# Patient Record
Sex: Male | Born: 1975 | Race: Black or African American | Hispanic: No | Marital: Single | Smoking: Former smoker
Health system: Southern US, Community
[De-identification: ages and names within clinical notes are randomized; demographics above are authoritative.]

---

## 2004-11-07 ENCOUNTER — Emergency Department: Payer: Self-pay | Admitting: Emergency Medicine

## 2004-12-03 ENCOUNTER — Emergency Department: Payer: Self-pay | Admitting: Emergency Medicine

## 2005-01-06 ENCOUNTER — Emergency Department: Payer: Self-pay | Admitting: Emergency Medicine

## 2005-03-28 ENCOUNTER — Emergency Department: Payer: Self-pay | Admitting: Emergency Medicine

## 2005-03-29 ENCOUNTER — Emergency Department: Payer: Self-pay | Admitting: Emergency Medicine

## 2006-05-06 ENCOUNTER — Emergency Department: Payer: Self-pay | Admitting: Emergency Medicine

## 2007-07-14 IMAGING — CR DG SHOULDER 3+V*R*
1 series · 4 of 4 positions shown · non-contrast
Comparison: none

REASON FOR EXAM: PAIN
COMMENTS:

[Series 1537: antero_posterior · 0.22mm/px · 4 of 4 slices shown]
[im 1/4]
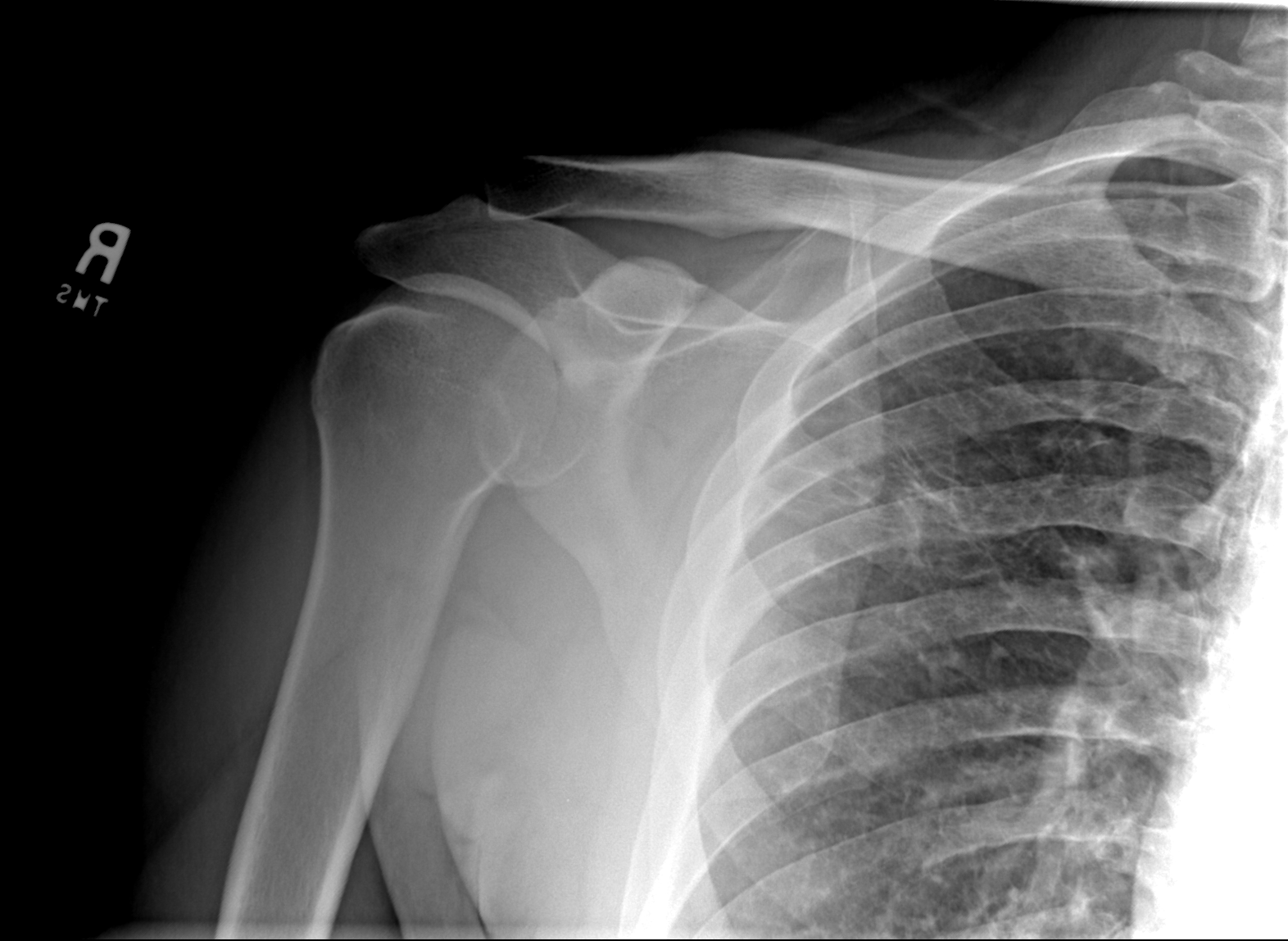
[im 2/4]
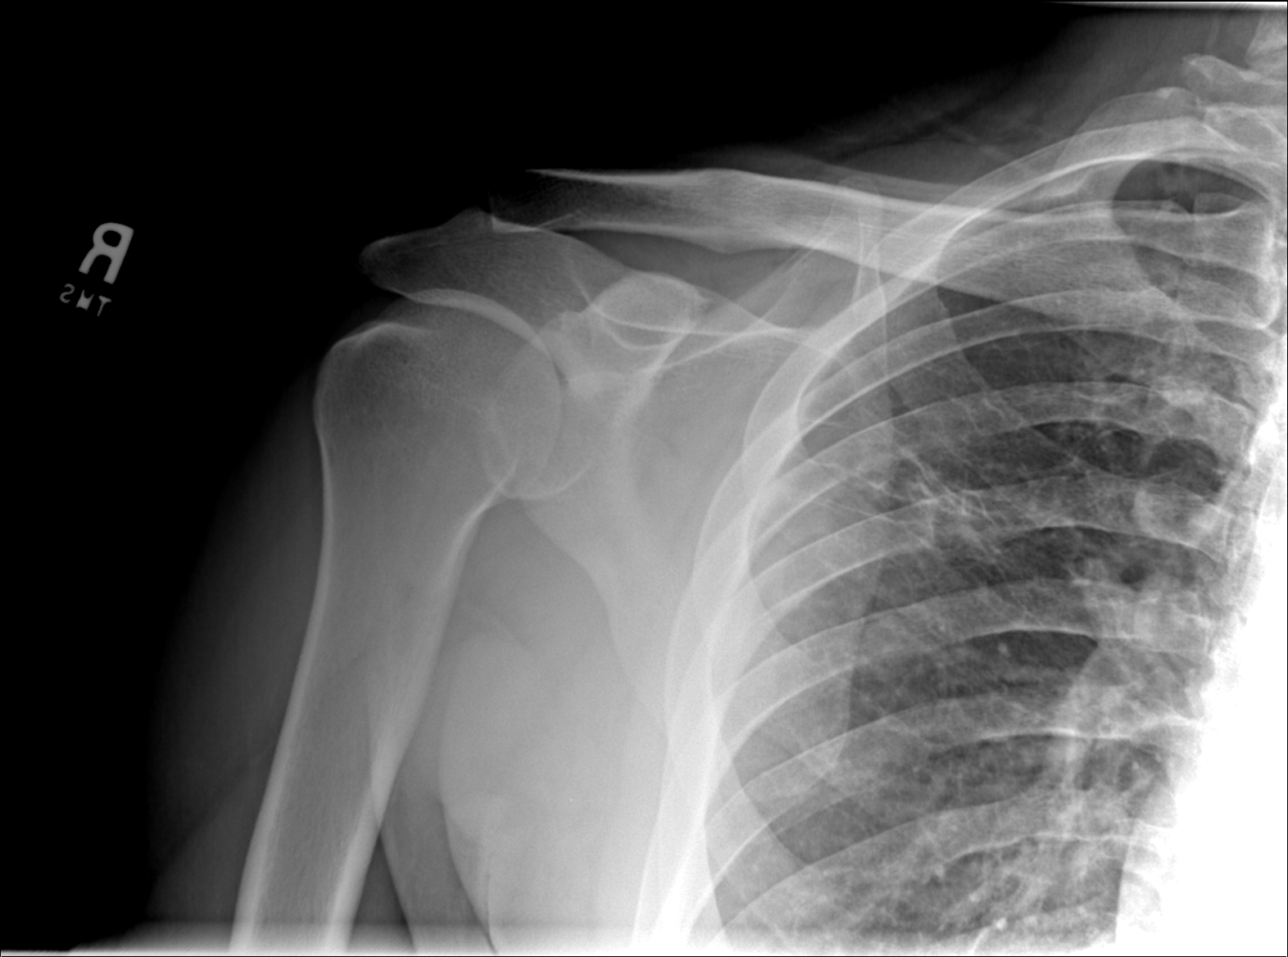
[im 3/4]
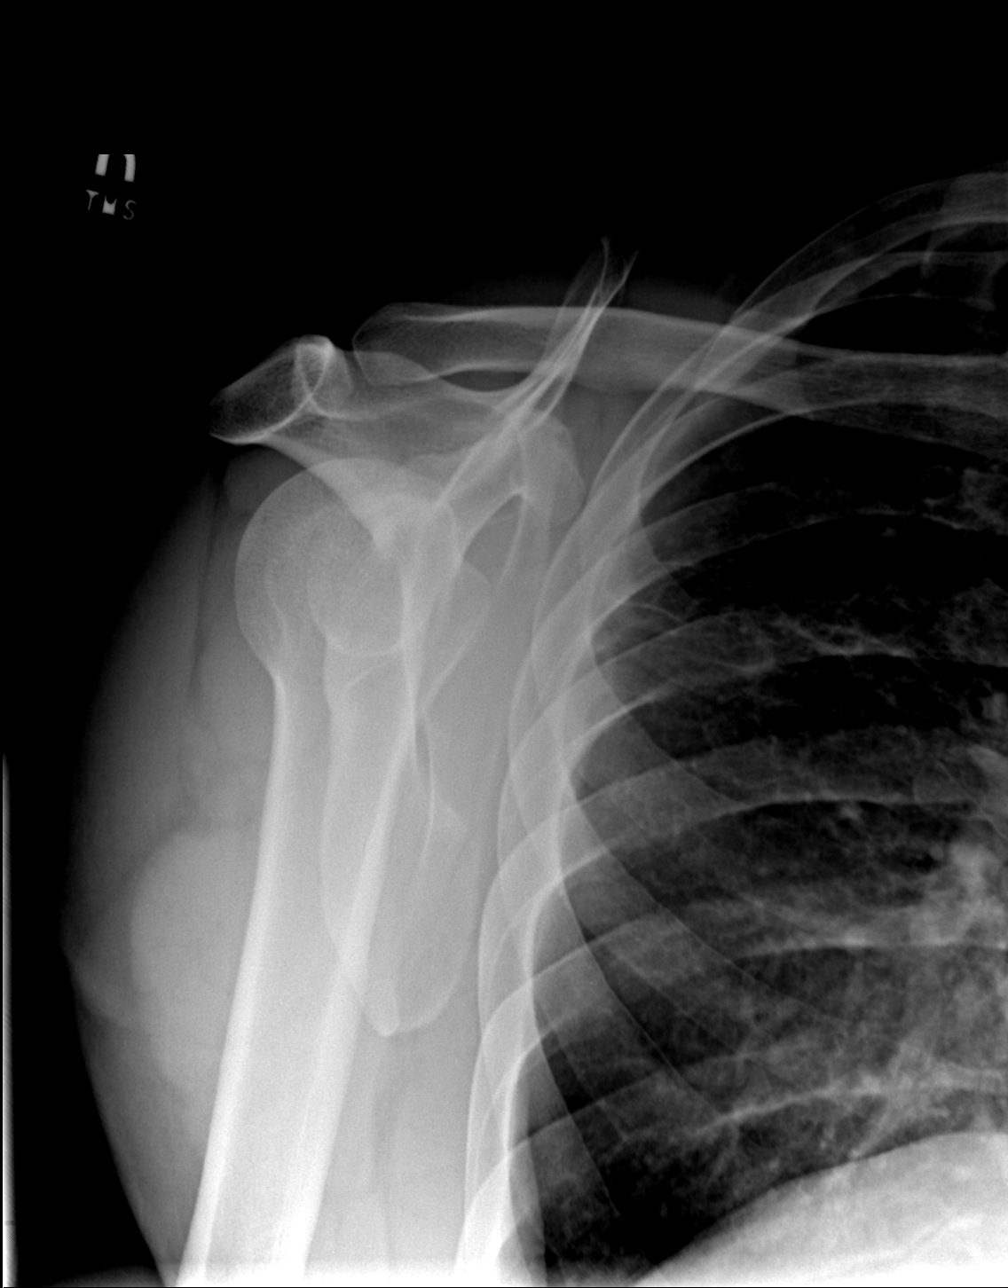
[im 4/4]
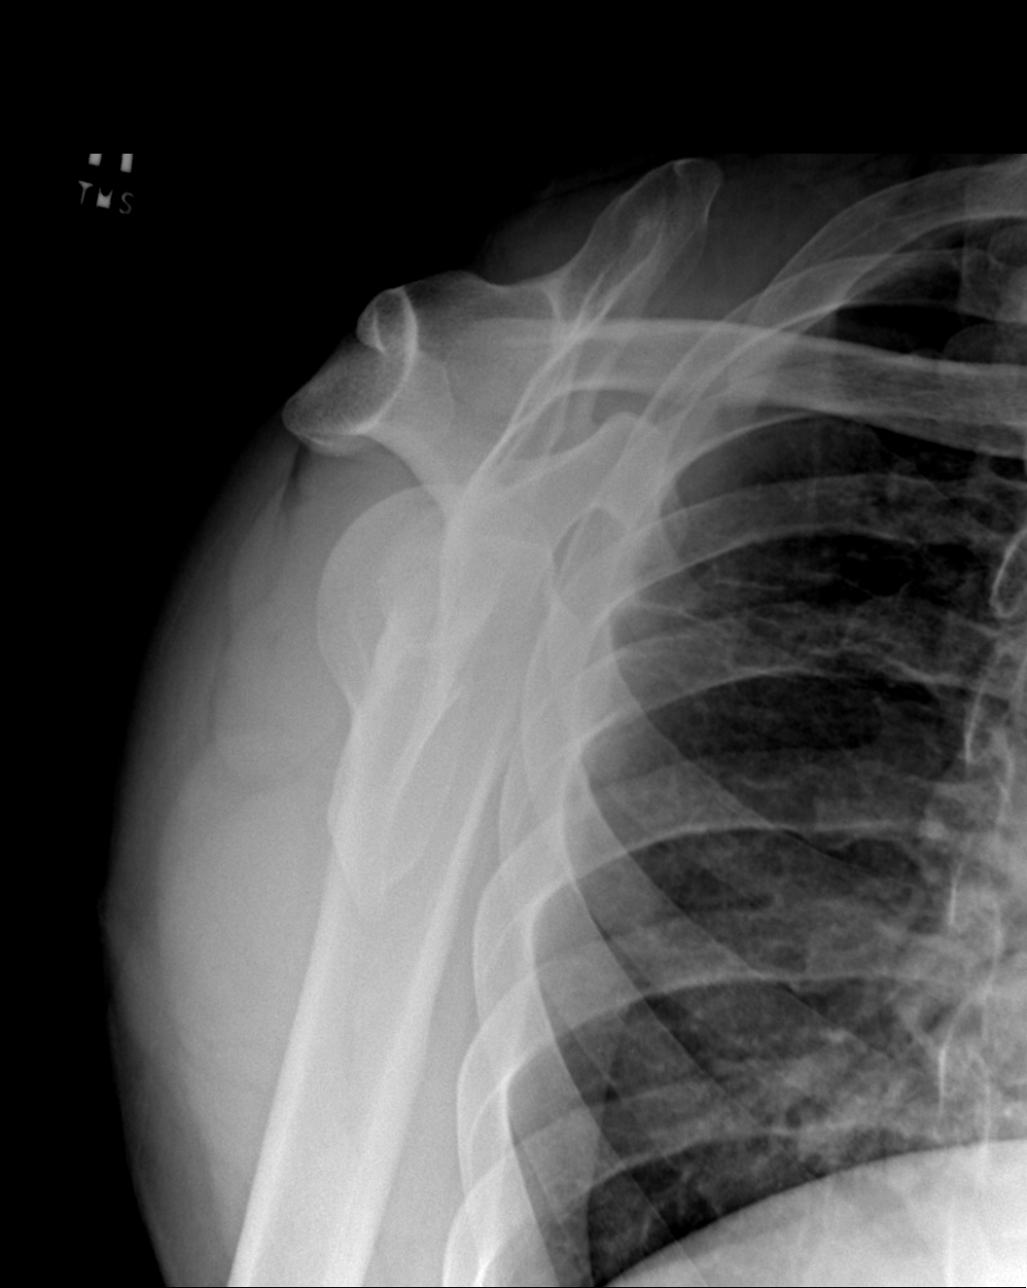

[4 of 4 positions shown; findings below may reference images not displayed]

PROCEDURE:     DXR - DXR SHOULDER RIGHT COMPLETE  - November 08, 2004 [DATE]

RESULT:         The bones of the shoulder appear adequately mineralized.  I
see no evidence of fracture nor dislocation.   The observed portions of the
RIGHT rib cage exhibit no acute abnormalities.  Some fibrotic change in the
RIGHT upper lobe is present.
IMPRESSION: I do not see evidence of acute bony abnormality of the
RIGHT shoulder.  It would be of value to consider Mr. Hham for a PA and
lateral chest x-ray to further evaluate an area of linear increased density
in the RIGHT upper lobe.

## 2011-09-04 ENCOUNTER — Emergency Department: Payer: Self-pay | Admitting: Emergency Medicine

## 2013-08-04 ENCOUNTER — Emergency Department: Payer: Self-pay | Admitting: Emergency Medicine

## 2014-05-05 ENCOUNTER — Emergency Department: Payer: Self-pay | Admitting: Emergency Medicine

## 2015-07-13 ENCOUNTER — Emergency Department
Admission: EM | Admit: 2015-07-13 | Discharge: 2015-07-13 | Disposition: A | Discharge status: Home / Self Care | Payer: Self-pay | Attending: Emergency Medicine | Admitting: Emergency Medicine

## 2015-07-13 ENCOUNTER — Encounter: Payer: Self-pay | Admitting: Emergency Medicine

## 2015-07-13 DIAGNOSIS — F17210 Nicotine dependence, cigarettes, uncomplicated: Secondary | ICD-10-CM | POA: Insufficient documentation

## 2015-07-13 DIAGNOSIS — K029 Dental caries, unspecified: Secondary | ICD-10-CM | POA: Insufficient documentation

## 2015-07-13 MED ORDER — DOCUSATE SODIUM 100 MG PO CAPS: ORAL_CAPSULE | ORAL | Status: AC

## 2015-07-13 MED ORDER — PENICILLIN V POTASSIUM 500 MG PO TABS: 500.0000 mg | ORAL_TABLET | Freq: Four times a day (QID) | ORAL | Status: AC

## 2015-07-13 MED ORDER — KETOROLAC TROMETHAMINE 60 MG/2ML IM SOLN
15.0000 mg | Freq: Once | INTRAMUSCULAR | Status: AC
  Administered 2015-07-13: 15 mg via INTRAMUSCULAR
  Filled 2015-07-13: qty 2

## 2015-07-13 MED ORDER — HYDROCODONE-ACETAMINOPHEN 5-325 MG PO TABS: 1.0000 | ORAL_TABLET | ORAL | Status: AC | PRN

## 2015-07-13 NOTE — ED Notes (Signed)
Pt ambulatory to triage c/o of dental pain.  Pt appears with top right tooth missing, broke at gums, pt right cheek side swollen.  Pt in apparent pain.

## 2015-07-13 NOTE — ED Notes (Signed)
MD at bedside. 

## 2015-07-13 NOTE — Discharge Instructions (Signed)
You have been seen in the Emergency Department (ED) today for dental pain.  Please take your prescribed antibiotic.  You may take pain medication as needed but ONLY as prescribed.  You should also take over-the-counter pain medication such as ibuprofen according to the label instructions unless a doctor has previously told you to avoid this type of medication (due to stomach ulcers, for example).  Alternatively you can take ibuprofen 600 mg by mouth three times daily with meals for no more than 5 days.  Take Norco as prescribed for severe pain. Do not drink alcohol, drive or participate in any other potentially dangerous activities while taking this medication as it may make you sleepy. Do not take this medication with any other sedating medications, either prescription or over-the-counter. If you were prescribed Percocet or Vicodin, do not take these with acetaminophen (Tylenol) as it is already contained within these medications.   This medication is an opiate (or narcotic) pain medication and can be habit forming.  Use it as little as possible to achieve adequate pain control.  Do not use or use it with extreme caution if you have a history of opiate abuse or dependence.  If you are on a pain contract with your primary care doctor or a pain specialist, be sure to let them know you were prescribed this medication today from the [facility] Emergency Department.  This medication is intended for your use only - do not give any to anyone else and keep it in a secure place where nobody else, especially children, have access to it.  It will also cause or worsen constipation, so you may want to consider taking an over-the-counter stool softener while you are taking this medication.  Please see you dentist as soon as possible; only a dentist will be able to fix your problem(s).  Please see below for dental follow up options.  Return to the ED if you develop worsening pain, fever, pus/drainage, difficulty  breathing, or other symptoms that concern you.   Dental Caries Dental caries (also called tooth decay) is the most common oral disease. It can occur at any age but is more common in children and young adults.  HOW DENTAL CARIES DEVELOPS  The process of decay begins when bacteria and foods (particularly sugars and starches) combine in your mouth to produce plaque. Plaque is a substance that sticks to the hard, outer surface of a tooth (enamel). The bacteria in plaque produce acids that attack enamel. These acids may also attack the root surface of a tooth (cementum) if it is exposed. Repeated attacks dissolve these surfaces and create holes in the tooth (cavities). If left untreated, the acids destroy the other layers of the tooth.  RISK FACTORS  Frequent sipping of sugary beverages.   Frequent snacking on sugary and starchy foods, especially those that easily get stuck in the teeth.   Poor oral hygiene.   Dry mouth.   Substance abuse such as methamphetamine abuse.   Broken or poor-fitting dental restorations.   Eating disorders.   Gastroesophageal reflux disease (GERD).   Certain radiation treatments to the head and neck. SYMPTOMS In the early stages of dental caries, symptoms are seldom present. Sometimes white, chalky areas may be seen on the enamel or other tooth layers. In later stages, symptoms may include:  Pits and holes on the enamel.  Toothache after sweet, hot, or cold foods or drinks are consumed.  Pain around the tooth.  Swelling around the tooth. DIAGNOSIS  Most  of the time, dental caries is detected during a regular dental checkup. A diagnosis is made after a thorough medical and dental history is taken and the surfaces of your teeth are checked for signs of dental caries. Sometimes special instruments, such as lasers, are used to check for dental caries. Dental X-ray exams may be taken so that areas not visible to the eye (such as between the contact areas  of the teeth) can be checked for cavities.  TREATMENT  If dental caries is in its early stages, it may be reversed with a fluoride treatment or an application of a remineralizing agent at the dental office. Thorough brushing and flossing at home is needed to aid these treatments. If it is in its later stages, treatment depends on the location and extent of tooth destruction:   If a small area of the tooth has been destroyed, the destroyed area will be removed and cavities will be filled with a material such as gold, silver amalgam, or composite resin.   If a large area of the tooth has been destroyed, the destroyed area will be removed and a cap (crown) will be fitted over the remaining tooth structure.   If the center part of the tooth (pulp) is affected, a procedure called a root canal will be needed before a filling or crown can be placed.   If most of the tooth has been destroyed, the tooth may need to be pulled (extracted). HOME CARE INSTRUCTIONS You can prevent, stop, or reverse dental caries at home by practicing good oral hygiene. Good oral hygiene includes:  Thoroughly cleaning your teeth at least twice a day with a toothbrush and dental floss.   Using a fluoride toothpaste. A fluoride mouth rinse may also be used if recommended by your dentist or health care provider.   Restricting the amount of sugary and starchy foods and sugary liquids you consume.   Avoiding frequent snacking on these foods and sipping of these liquids.   Keeping regular visits with a dentist for checkups and cleanings. PREVENTION   Practice good oral hygiene.  Consider a dental sealant. A dental sealant is a coating material that is applied by your dentist to the pits and grooves of teeth. The sealant prevents food from being trapped in them. It may protect the teeth for several years.  Ask about fluoride supplements if you live in a community without fluorinated water or with water that has a low  fluoride content. Use fluoride supplements as directed by your dentist or health care provider.  Allow fluoride varnish applications to teeth if directed by your dentist or health care provider.   This information is not intended to replace advice given to you by your health care provider. Make sure you discuss any questions you have with your health care provider.   Document Released: 10/23/2001 Document Revised: 02/21/2014 Document Reviewed: 02/03/2012 Elsevier Interactive Patient Education 2016 Elsevier Inc.    Dental Pain   Dental pain may be caused by many things, including:  Tooth decay (cavities or caries). Cavities expose the nerve of your tooth to air and hot or cold temperatures. This can cause pain or discomfort.  Abscess or infection. A dental abscess is a collection of infected pus from a bacterial infection in the inner part of the tooth (pulp). It usually occurs at the end of the tooth's root.  Injury.  An unknown reason (idiopathic). Your pain may be mild or severe. It may only occur when:  You are  chewing.  You are exposed to hot or cold temperature.  You are eating or drinking sugary foods or beverages, such as soda or candy. Your pain may also be constant.  HOME CARE INSTRUCTIONS  Watch your dental pain for any changes. The following actions may help to lessen any discomfort that you are feeling:  Take medicines only as directed by your dentist.  If you were prescribed an antibiotic medicine, finish all of it even if you start to feel better.  Keep all follow-up visits as directed by your dentist. This is important.  Do not apply heat to the outside of your face.  Rinse your mouth or gargle with salt water if directed by your dentist. This helps with pain and swelling.  You can make salt water by adding  tsp of salt to 1 cup of warm water. Apply ice to the painful area of your face:  Put ice in a plastic bag.  Place a towel between your skin and the bag.  Leave  the ice on for 20 minutes, 2-3 times per day. Avoid foods or drinks that cause you pain, such as:  Very hot or very cold foods or drinks.  Sweet or sugary foods or drinks. SEEK MEDICAL CARE IF:  Your pain is not controlled with medicines.  Your symptoms are worse.  You have new symptoms. SEEK IMMEDIATE MEDICAL CARE IF:  You are unable to open your mouth.  You are having trouble breathing or swallowing.  You have a fever.  Your face, neck, or jaw is swollen. This information is not intended to replace advice given to you by your health care provider. Make sure you discuss any questions you have with your health care provider.  Document Released: 01/31/2005 Document Revised: 06/17/2014 Document Reviewed: 01/27/2014  Elsevier Interactive Patient Education 2016 [employer]    OPTIONS FOR DENTAL FOLLOW UP CARE  [place] Department of Health and Human Services - Local Safety Net Dental Clinics [email or web address]://www.ncdhhs.gov/dph/oralhealth/services/safetynetclinics.htm   [facility] [phone])  [facility] [phone])  [facility] [phone]  [facility] [phone])  [facility] [phone]) This clinic caters to the indigent population and is on a lottery system. Location: [employer] [address], [address], [place] Clinic Hours: Wednesdays from 6pm - 9pm, patients seen by a lottery system. For dates, call or go to [email or web address] Services: Cleanings, fillings and simple extractions. Payment Options: DENTAL WORK IS FREE OF CHARGE. Bring proof of income or support. Best way to get seen: Arrive at 5:15 pm - this is a lottery, NOT first come/first serve, so arriving earlier will not increase your chances of being seen.     [facility] [phone] Select option 1 for emergencies   Location: [facility] [address],  [address], [place] Clinic Hours: No walk-ins accepted - call the day before to schedule an appointment. Check in times are 9:30 am and 1:30 pm. Services: Simple extractions, temporary fillings, pulpectomy/pulp debridement, uncomplicated abscess drainage. Payment Options: PAYMENT IS DUE AT THE TIME OF SERVICE.  Fee is usually $100-200, additional surgical procedures (e.g. abscess drainage) may be extra. Cash, checks, Visa/MasterCard accepted.  Can file Medicaid if patient is covered for dental - patient should call case worker to check. No discount for [facility] patients. Best way to get seen: MUST call the day before and get onto the schedule. Can usually be seen the next 1-2  days. No walk-ins accepted.     [facility] [phone]   Location: [facility], [address] [place] Clinic Hours: M, W, Th, F 8am or 1:30pm, Tues 9a or 1:30 - first come/first served. Services: Simple extractions, temporary fillings, uncomplicated abscess drainage.  You do not need to be an [facility] resident. Payment Options: PAYMENT IS DUE AT THE TIME OF SERVICE. Dental insurance, otherwise sliding scale - bring proof of income or support. Depending on income and treatment needed, cost is usually $50-200. Best way to get seen: Arrive early as it is first come/first served.     [facility] [phone]   Location: [address] Clinic Hours: Mon-Thu 8a-5p Services: Most basic dental services including extractions and fillings. Payment Options: PAYMENT IS DUE AT THE TIME OF SERVICE. Sliding scale, up to 50% off - bring proof if income or support. Medicaid with dental option accepted. Best way to get seen: Call to schedule an appointment, can usually be seen within 2 weeks OR they will try to see walk-ins - show up at 8a or 2p (you may have to wait).     [facility] [phone] [place] RESIDENTS ONLY   Location: [facility], [address] [place], [place] [address] Clinic Hours: By appointment only. Monday - Thursday 8am-5pm, Friday 8am-12pm Services: Cleanings, fillings, extractions. Payment Options: PAYMENT IS DUE AT THE TIME OF SERVICE. Cash, Visa or MasterCard. Sliding scale - $30 minimum per service. Best way to get seen: Come in to office, complete packet and make an appointment - need proof of income or support monies for each household member and proof of [facility] residence. Usually takes about a month to get in.     [facility] [phone]   Location: [address], [facility] Clinic Hours: Walk-in Urgent Care Dental Services are offered Monday-Friday mornings only. The numbers of emergencies accepted daily is limited to the number of providers available. Maximum 15 - Mondays, Wednesdays & Thursdays Maximum 10 - Tuesdays & Fridays Services: You do not need to be a [facility] resident to be seen for a dental emergency. Emergencies are defined as pain, swelling, abnormal bleeding, or dental trauma. Walkins will receive x-rays if needed. NOTE: Dental cleaning is not an emergency. Payment Options: PAYMENT IS DUE AT THE TIME OF SERVICE. Minimum co-pay is $40.00 for uninsured patients. Minimum co-pay is $3.00 for Medicaid with dental coverage. Dental Insurance is accepted and must be presented at time of visit. Medicare does not cover dental. Forms of payment: Cash, credit card, checks. Best way to get seen: If not previously registered with the clinic, walk-in dental registration begins at 7:15 am and is on a first come/first serve basis. If previously registered with the clinic, call to make an appointment.     [facility] [phone] [place] RESIDENTS ONLY   Location: [address], [place], [place] Clinic Hours: Mon-Thu  10a-2p Services: Extractions only! Payment Options: FREE (donations accepted) - bring proof of income or support Best way to get seen: Call and schedule an appointment OR come at 8am on the 1st Monday of every month (except for holidays) when it is first come/first served.     [facility] [phone]   Location: [address] [place], [place] Clinic Hours: Friday mornings Services, Payment Options, Best way to get seen: Call for info

## 2015-07-13 NOTE — ED Notes (Signed)
Pt reports tooth ache and broken tooth for nine months with pain worsening over past three days.  Pt reports he took ibuprofen 800mg  last night at 2300 with no relief.  Woke up this am with continued pain.  Has nothing to eat or drink and came to ED.  No pain meds taken this am

## 2015-07-13 NOTE — ED Provider Notes (Signed)
[facility] Emergency Department Provider Note  ____________________________________________  Time seen: Approximately 6:24 AM  I have reviewed the triage vital signs and the nursing notes.   HISTORY  Chief Complaint Dental Pain    HPI Anthony Tucker is a 40 y.o. male who is generally healthy and well-appearing with no CVA.  Past medical history who presents with acutely worsening dental pain in the upper right side of his mouth.  He reports that he has had problems for months in that area but that it became acutely worse over the last 3 days with some redness and swelling around one of the back teeth on the upper right side.  He states that this is been cracked and partially missing before but that it feels like "a nerve is exposed".  Anything he eats makes it worse and the pain is severe with extreme sensitivity to any temperature variation.  Rest makes it a little bit better.  He denies fever/chills, chest pain, shortness of breath, nausea, vomiting, diarrhea, dysuria.He has not seen a dentist recently.  He is not having any difficulty swallowing.  He does not have a sore throat.  He denies any recent trauma.   History reviewed. No pertinent past medical history.  There are no active problems to display for this patient.   History reviewed. No pertinent past surgical history.  Current Outpatient Rx  Name  Route  Sig  Dispense  Refill  . docusate sodium (COLACE) 100 MG capsule      Take 1 tablet once or twice daily as needed for constipation while taking narcotic pain medicine   30 capsule   0   . HYDROcodone-acetaminophen (NORCO/VICODIN) 5-325 MG tablet   Oral   Take 1-2 tablets by mouth every 4 (four) hours as needed for moderate pain.   15 tablet   0   . penicillin v potassium (VEETID) 500 MG tablet   Oral   Take 1 tablet (500 mg total) by mouth 4 (four) times daily.   40 tablet   0     Allergies Shellfish allergy  History  reviewed. No pertinent family history.  Social History Social History  Substance Use Topics  . Smoking status: Current Some Day Smoker -- 0.25 packs/day    Types: Cigarettes  . Smokeless tobacco: None  . Alcohol Use: No    Review of Systems Constitutional: No fever/chills Eyes: No visual changes. ENT: No sore throat. Pain in his mouth around his teeth in the upper right back part of his mouth. Cardiovascular: Denies chest pain. Respiratory: Denies shortness of breath. Gastrointestinal: No abdominal pain.  No nausea, no vomiting.  No diarrhea.  No constipation. Genitourinary: Negative for dysuria. Musculoskeletal: Negative for back pain. Skin: Negative for rash. Neurological: Negative for headaches, focal weakness or numbness.  10-point ROS otherwise negative.  ____________________________________________   PHYSICAL EXAM:  VITAL SIGNS: ED Triage Vitals  Enc Vitals Group     BP 07/13/15 0617 161/95 mmHg     Pulse Rate 07/13/15 0617 69     Resp 07/13/15 0617 20     Temp 07/13/15 0617 98.2 F (36.8 C)     Temp src --      SpO2 07/13/15 0617 99 %     Weight 07/13/15 0617 233 lb (105.688 kg)     Height 07/13/15 0617  (1.88 m)     Head Cir --      Peak Flow --      Pain Score  07/13/15 0618 9     Pain Loc --      Pain Edu? --      Excl. in GC? --     Constitutional: Alert and oriented. Well appearing and in no acute distress. Mouth/Throat: Mucous membranes are moist.  Oropharynx non-erythematous.  The patient has extensive dental caries and multiple missing teeth.  It appears that the teeth that constitute the biggest problem for him are numbers 1 and 2.  Have extensive chronic caries and #1 in particular seems to be mostly absent it is very tender to palpation.  There is some redness of the surrounding gums but no evidence of an acute abscess.  He has no evidence of peritonsillar abscess, no Ludwig's angina, and no other acute issue evident on exam Neck: No stridor.   No meningeal signs.   Cardiovascular: Normal rate, regular rhythm. Good peripheral circulation.  Respiratory: Normal respiratory effort.  No retractions.  Musculoskeletal: No lower extremity tenderness nor edema. No gross deformities of extremities. Neurologic:  Normal speech and language. No gross focal neurologic deficits are appreciated.  Skin:  Skin is warm, dry and intact. No rash noted. Psychiatric: Mood and affect are normal. Speech and behavior are normal.  ____________________________________________   LABS (all labs ordered are listed, but only abnormal results are displayed)  Labs Reviewed - No data to display ____________________________________________  EKG  None ____________________________________________  RADIOLOGY   No results found.  ____________________________________________   PROCEDURES  Procedure(s) performed: None  Critical Care performed: No ____________________________________________   INITIAL IMPRESSION / ASSESSMENT AND PLAN / ED COURSE  Pertinent labs & imaging results that were available during my care of the patient were reviewed by me and considered in my medical decision making (see chart for details).  I explained to the patient that he needs to see a dentist for care of his long-term dental problems.  I checked in the [place] controlled substances database and he has had no prescriptions filled in the last 6 months.  Given that his pain is acute over the last 3 days and it is Memorial Day weekend and he would not be able to see a dentist even if he tried to, I gave him intramuscular injection of Toradol 15 mg and a prescription for some Norco as well as penicillin.  I stressed to him that this is a 1 time Norco prescription and that he must follow-up with a dentist.  He states that he understands and agrees with the plan.   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     MEDICATIONS GIVEN DURING THIS VISIT:  Medications  ketorolac (TORADOL) injection 15 mg (15 mg Intramuscular Given 07/13/15 0642)     NEW OUTPATIENT MEDICATIONS STARTED DURING THIS VISIT:  Discharge Medication List as of 07/13/2015  6:35 AM    START taking these medications   Details  docusate sodium (COLACE) 100 MG capsule Take 1 tablet once or twice daily as needed for constipation while taking narcotic pain medicine, Print    HYDROcodone-acetaminophen (NORCO/VICODIN) 5-325 MG tablet Take 1-2 tablets by mouth every 4 (four) hours as needed for moderate pain., Starting 07/13/2015, Until Discontinued, Print    penicillin v potassium (VEETID) 500 MG tablet Take 1 tablet (500 mg total) by mouth 4 (four) times daily., Starting 07/13/2015, Until Discontinued, Print          Note:  This document was prepared using Dragon voice recognition  software and may include unintentional dictation errors.   Loleta Rose, MD 07/13/15 [phone]

## 2015-07-13 NOTE — ED Notes (Signed)
Pt discharged to home, pain managed with IM torodal.  Discharge instructions reviewed with patient.  Follow-up instructions reviewed with patient.  Prescription provided to patient.  Pt ambulatory

## 2016-02-15 ENCOUNTER — Emergency Department
Admission: EM | Admit: 2016-02-15 | Discharge: 2016-02-15 | Disposition: A | Discharge status: Home / Self Care | Payer: Self-pay | Attending: Emergency Medicine | Admitting: Emergency Medicine

## 2016-02-15 DIAGNOSIS — J039 Acute tonsillitis, unspecified: Secondary | ICD-10-CM | POA: Insufficient documentation

## 2016-02-15 DIAGNOSIS — F17210 Nicotine dependence, cigarettes, uncomplicated: Secondary | ICD-10-CM | POA: Insufficient documentation

## 2016-02-15 LAB — POCT RAPID STREP A: STREPTOCOCCUS, GROUP A SCREEN (DIRECT): NEGATIVE

## 2016-02-15 MED ORDER — AMOXICILLIN 500 MG PO CAPS: 500.0000 mg | ORAL_CAPSULE | Freq: Three times a day (TID) | ORAL | 0 refills | Status: AC

## 2016-02-15 NOTE — ED Provider Notes (Signed)
[facility] Emergency Department Provider Note  ____________________________________________   First MD Initiated Contact with Patient 02/15/16 1310     (approximate)  I have reviewed the triage vital signs and the nursing notes.   HISTORY  Chief Complaint Sore Throat   HPI Anthony Tucker is a 41 y.o. male is here with complaint of sore throat. Patient states that he has had painful throat for the last 5 days and now with pain going into his left ear. He states he has had some fever at home but is not a she taken his temperature. He needs to eat and drink as normal but states that eating makes his throat hurt worse. He denies any cough, congestion, nausea or vomiting. Currently rates his pain as a 9 out of 10.   History reviewed. No pertinent past medical history.  There are no active problems to display for this patient.   History reviewed. No pertinent surgical history.  Prior to Admission medications   Medication Sig Start Date End Date Taking? Authorizing Provider  amoxicillin (AMOXIL) 500 MG capsule Take 1 capsule (500 mg total) by mouth 3 (three) times daily. 02/15/16   Tommi Rumpshonda L Rhoda Waldvogel, PA-C  docusate sodium (COLACE) 100 MG capsule Take 1 tablet once or twice daily as needed for constipation while taking narcotic pain medicine 07/13/15   Loleta Roseory Forbach, MD  HYDROcodone-acetaminophen (NORCO/VICODIN) 5-325 MG tablet Take 1-2 tablets by mouth every 4 (four) hours as needed for moderate pain. 07/13/15   Loleta Roseory Forbach, MD  penicillin v potassium (VEETID) 500 MG tablet Take 1 tablet (500 mg total) by mouth 4 (four) times daily. 07/13/15   Loleta Roseory Forbach, MD    Allergies Shellfish allergy  No family history on file.  Social History Social History  Substance Use Topics  . Smoking status: Current Some Day Smoker    Packs/day: 0.25    Types: Cigarettes  . Smokeless tobacco: Never Used  . Alcohol use No    Review of Systems Constitutional:  Positive subjective fever/no chills Eyes: No visual changes. ENT: Positive sore throat. Cardiovascular: Denies chest pain. Respiratory: Denies shortness of breath. Gastrointestinal:   No nausea, no vomiting.   Skin: Negative for rash. Neurological: Negative for headaches, focal weakness or numbness.  10-point ROS otherwise negative.  ____________________________________________   PHYSICAL EXAM:  VITAL SIGNS: ED Triage Vitals  Enc Vitals Group     BP 02/15/16 1254 (!) 139/93     Pulse Rate 02/15/16 1254 (!) 105     Resp 02/15/16 1254 16     Temp 02/15/16 1254 99.3 F (37.4 C)     Temp Source 02/15/16 1254 Oral     SpO2 02/15/16 1254 100 %     Weight 02/15/16 1255 225 lb (102.1 kg)     Height 02/15/16 1255 6\' 2"  (1.88 m)     Head Circumference --      Peak Flow --      Pain Score 02/15/16 1255 9     Pain Loc --      Pain Edu? --      Excl. in GC? --     Constitutional: Alert and oriented. Well appearing and in no acute distress. Eyes: Conjunctivae are normal. PERRL. EOMI. Head: Atraumatic. Nose: No congestion/rhinnorhea.  EACs are clear bilaterally. TMs are dull with left TM mild fluid present. No injection or erythema was noted. Mouth/Throat: Mucous membranes are moist.  Oropharynx Mild erythema with left sided tonsillar exudate noted. Neck: No stridor.  Hematological/Lymphatic/Immunilogical: No cervical lymphadenopathy. Cardiovascular: Normal rate, regular rhythm. Grossly normal heart sounds.  Good peripheral circulation. Respiratory: Normal respiratory effort.  No retractions. Lungs CTAB. Musculoskeletal: Moves upper and lower extremities without any difficulty. Normal gait was noted. Neurologic:  Normal speech and language. No gross focal neurologic deficits are appreciated. No gait instability. Skin:  Skin is warm, dry and intact. No rash noted. Psychiatric: Mood and affect are normal. Speech and behavior are  normal.  ____________________________________________   LABS (all labs ordered are listed, but only abnormal results are displayed)  Labs Reviewed  POCT RAPID STREP A     PROCEDURES  Procedure(s) performed: None  Procedures  Critical Care performed: No  ____________________________________________   INITIAL IMPRESSION / ASSESSMENT AND PLAN / ED COURSE  Pertinent labs & imaging results that were available during my care of the patient were reviewed by me and considered in my medical decision making (see chart for details).    Clinical Course    Strep test  was negative and patient was told that he was being treated for tonsillitis. He was given a prescription for amoxicillin 500 mg 3 times a day for 10 days. He is to continue taking Tylenol or ibuprofen as needed for throat pain and increase fluids. He is to follow-up with Dr. Jenne Campus if any continued problems with his throat.  ____________________________________________   FINAL CLINICAL IMPRESSION(S) / ED DIAGNOSES  Final diagnoses:  Tonsillitis      NEW MEDICATIONS STARTED DURING THIS VISIT:  New Prescriptions   AMOXICILLIN (AMOXIL) 500 MG CAPSULE    Take 1 capsule (500 mg total) by mouth 3 (three) times daily.     Note:  This document was prepared using Dragon voice recognition software and may include unintentional dictation errors.    Tommi Rumps, PA-C 02/15/16 1353    Sharman Cheek, MD 02/15/16 1534

## 2016-02-15 NOTE — Discharge Instructions (Signed)
Begin taking antibiotics and take for the complete course. Increase fluids. Tylenol or ibuprofen as needed for throat pain. [facility] or Dr. Jenne CampusMcQueen if any continued problems with your throat.

## 2016-02-15 NOTE — ED Triage Notes (Signed)
Sore throat X 5 days, worse with eating and drinking. Pt alert and oriented X4, active, cooperative, pt in NAD. RR even and unlabored, color WNL.  Pt provided mask at time of triage. Pt ambulatory.

## 2016-04-09 IMAGING — CR DG RIBS 2V*R*
1 series · 4 of 4 positions shown · non-contrast
Comparison: None.

CLINICAL DATA: Right anterior lower rib pain

EXAM:
RIGHT RIBS - 2 VIEW

[Series 1: w chest pa · 0.14mm/px · 4 of 4 slices shown]
[im 1/4]
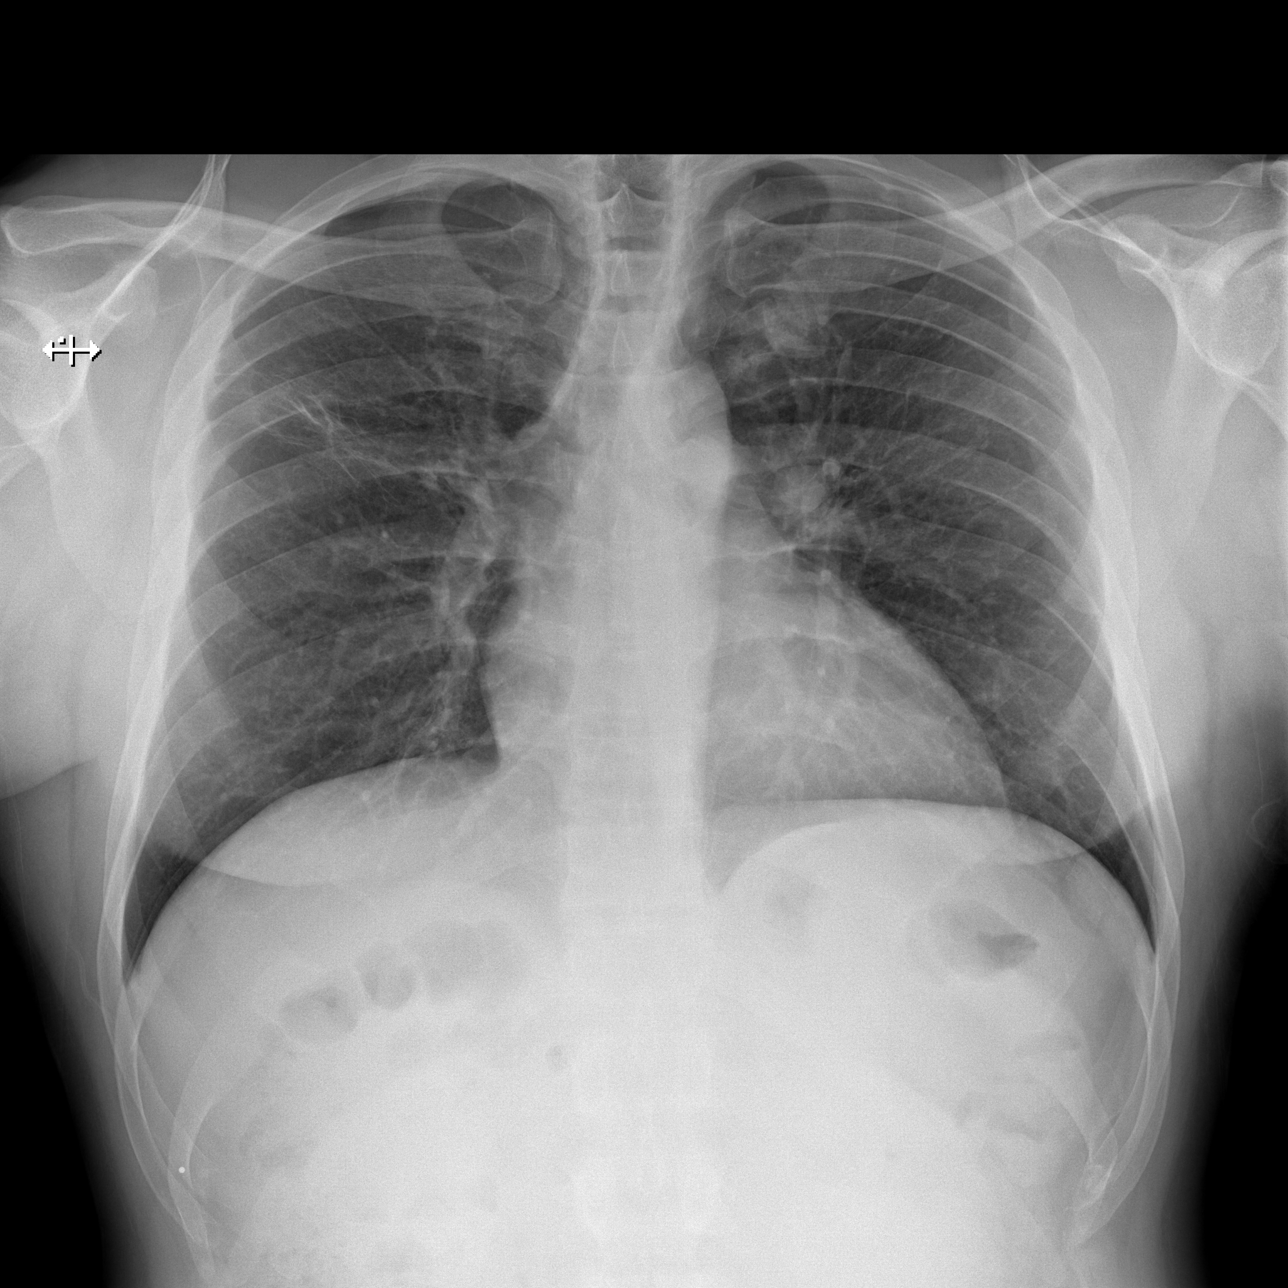
[im 2/4]
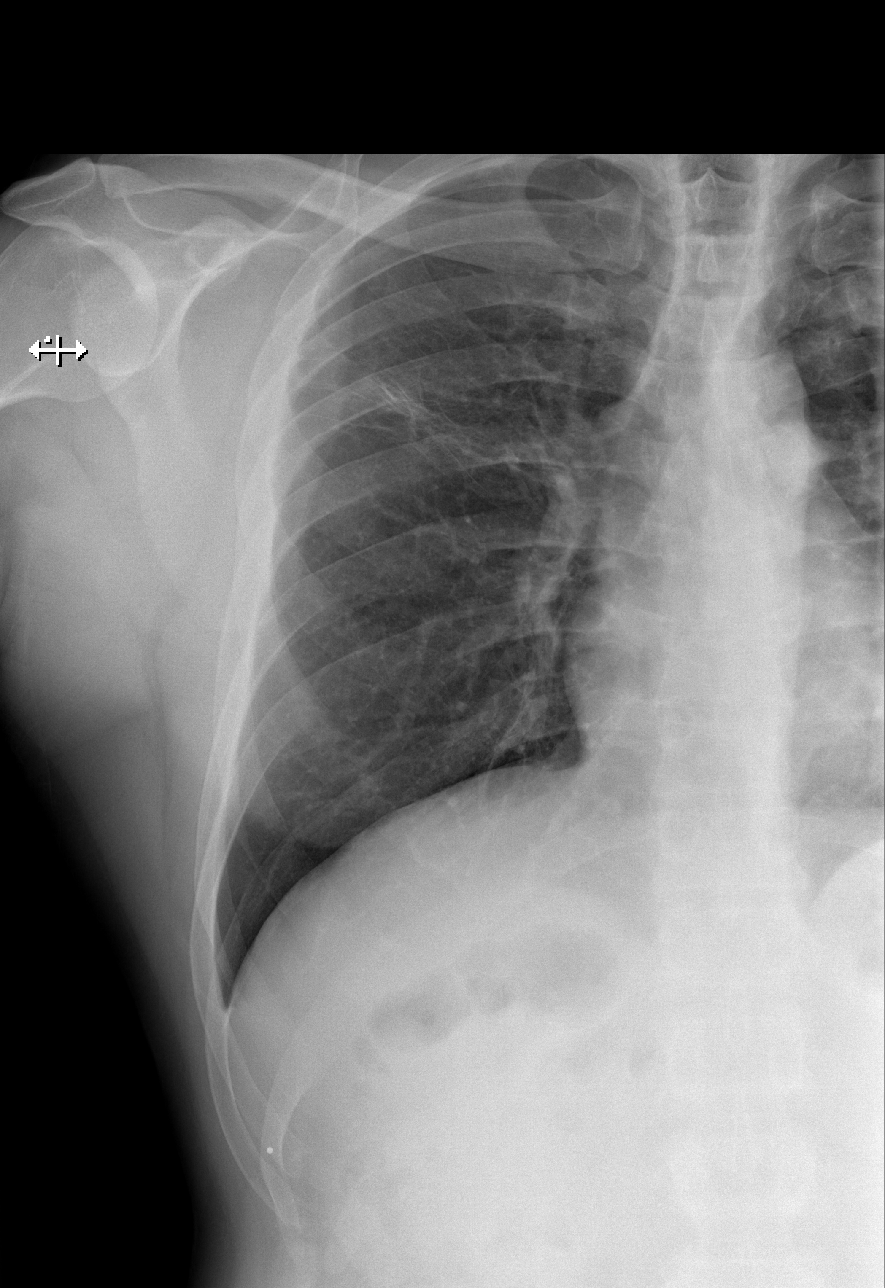
[im 3/4]
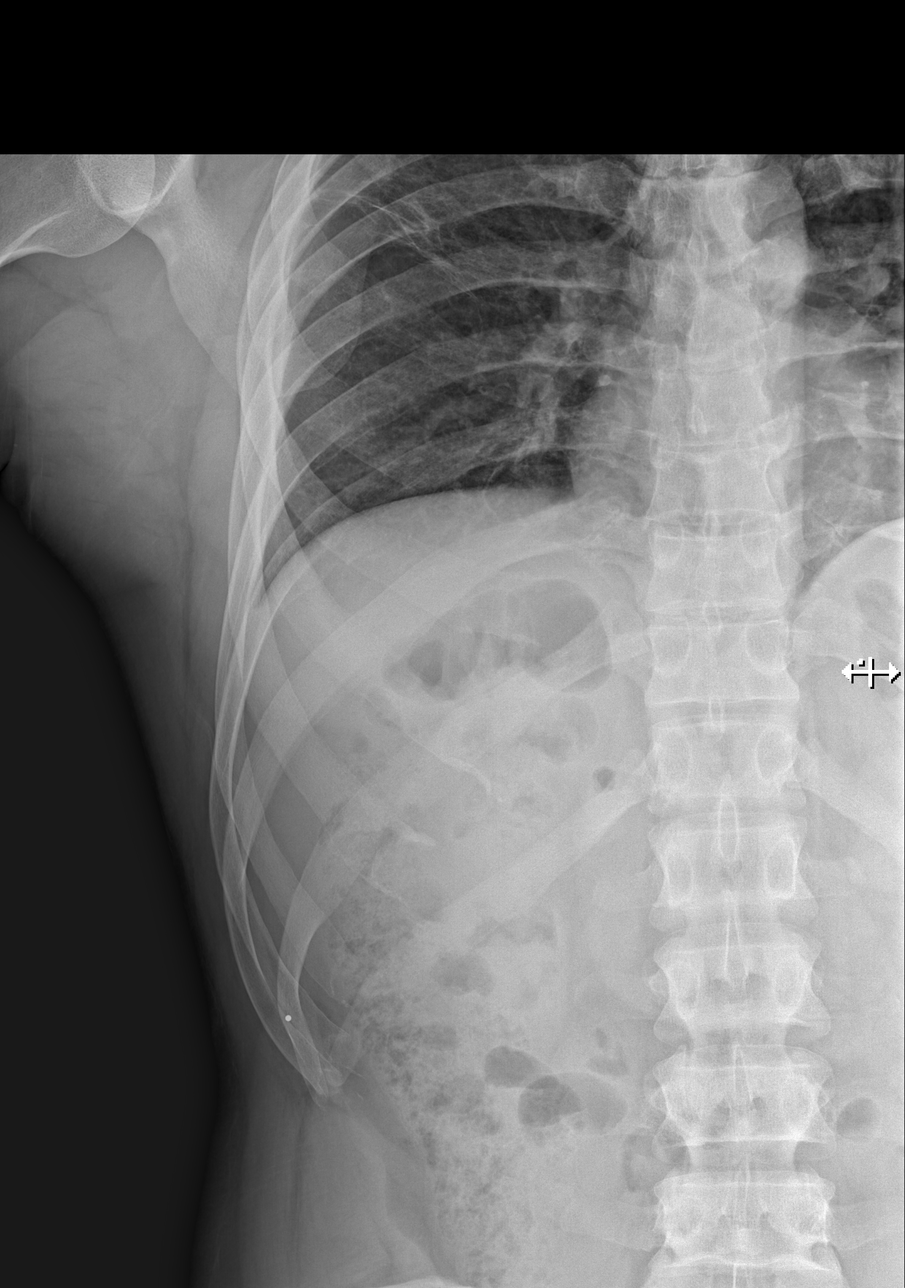
[im 4/4]
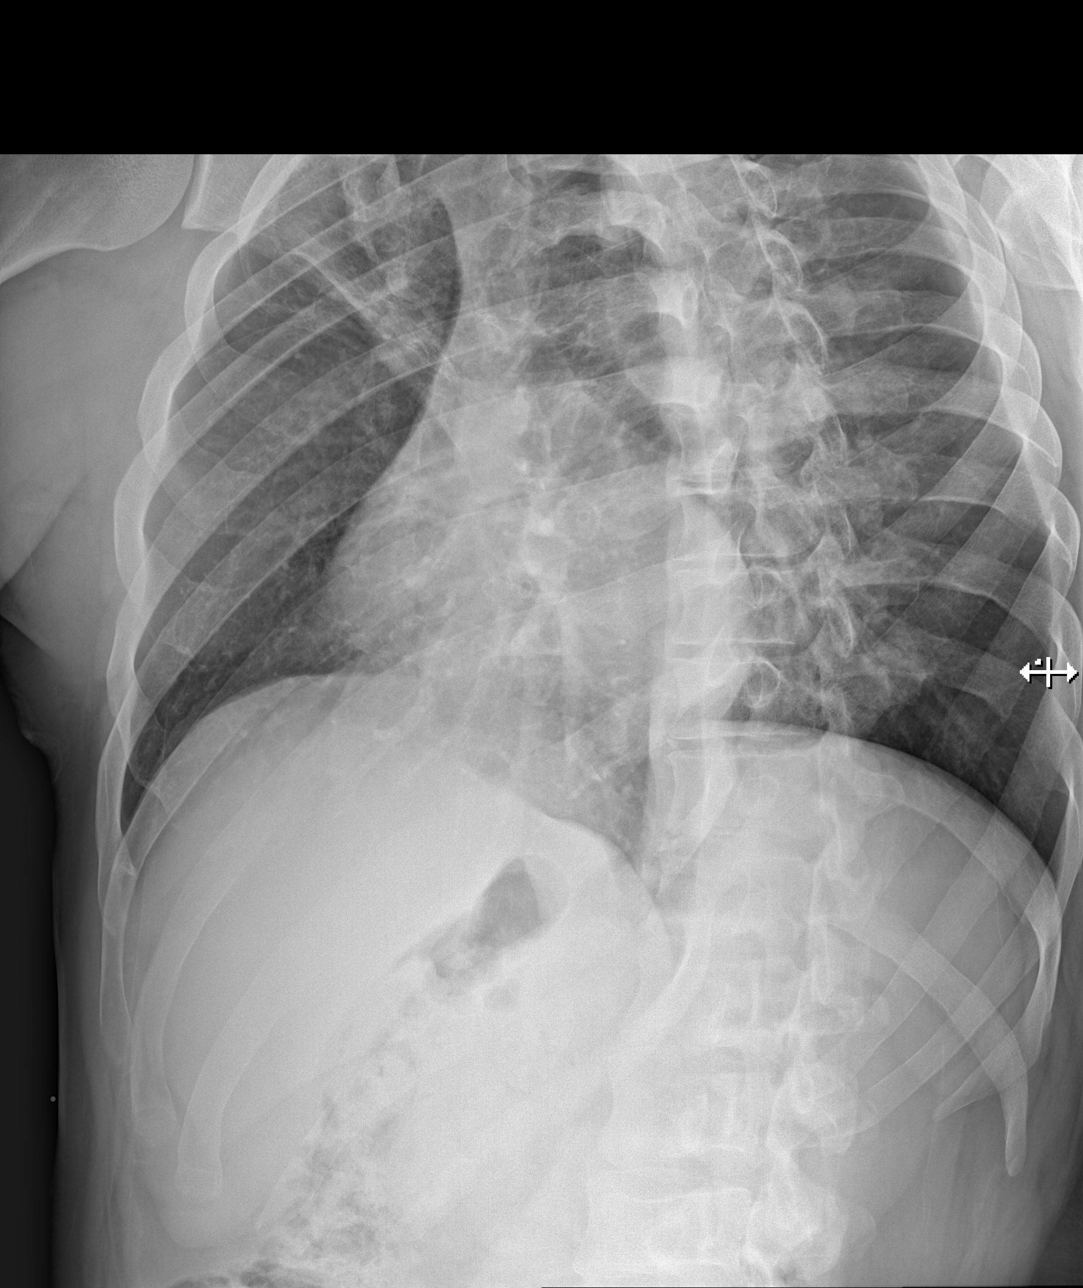

[4 of 4 positions shown; findings below may reference images not displayed]

FINDINGS: No fracture or other bone lesions are seen involving the ribs.
IMPRESSION: Negative.

## 2017-05-20 ENCOUNTER — Emergency Department
Admission: EM | Admit: 2017-05-20 | Discharge: 2017-05-20 | Disposition: A | Discharge status: Home / Self Care | Payer: No Typology Code available for payment source | Attending: Emergency Medicine | Admitting: Emergency Medicine

## 2017-05-20 ENCOUNTER — Other Ambulatory Visit: Payer: Self-pay

## 2017-05-20 ENCOUNTER — Encounter: Payer: Self-pay | Admitting: Emergency Medicine

## 2017-05-20 DIAGNOSIS — Y9389 Activity, other specified: Secondary | ICD-10-CM | POA: Diagnosis not present

## 2017-05-20 DIAGNOSIS — Y92410 Unspecified street and highway as the place of occurrence of the external cause: Secondary | ICD-10-CM | POA: Diagnosis not present

## 2017-05-20 DIAGNOSIS — S3992XA Unspecified injury of lower back, initial encounter: Secondary | ICD-10-CM | POA: Diagnosis present

## 2017-05-20 DIAGNOSIS — Y999 Unspecified external cause status: Secondary | ICD-10-CM | POA: Insufficient documentation

## 2017-05-20 DIAGNOSIS — Z79899 Other long term (current) drug therapy: Secondary | ICD-10-CM | POA: Insufficient documentation

## 2017-05-20 DIAGNOSIS — F17210 Nicotine dependence, cigarettes, uncomplicated: Secondary | ICD-10-CM | POA: Diagnosis not present

## 2017-05-20 DIAGNOSIS — S39012A Strain of muscle, fascia and tendon of lower back, initial encounter: Secondary | ICD-10-CM | POA: Insufficient documentation

## 2017-05-20 MED ORDER — TRAMADOL HCL 50 MG PO TABS
50.0000 mg | ORAL_TABLET | Freq: Two times a day (BID) | ORAL | 0 refills | Status: AC | PRN
Start: 2017-05-20 — End: ?

## 2017-05-20 MED ORDER — TRAMADOL HCL 50 MG PO TABS
50.0000 mg | ORAL_TABLET | Freq: Once | ORAL | Status: AC
  Administered 2017-05-20: 50 mg via ORAL
  Filled 2017-05-20: qty 1

## 2017-05-20 MED ORDER — IBUPROFEN 600 MG PO TABS: 600.0000 mg | ORAL_TABLET | Freq: Three times a day (TID) | ORAL | 0 refills | Status: AC | PRN

## 2017-05-20 MED ORDER — CYCLOBENZAPRINE HCL 10 MG PO TABS: 10.0000 mg | ORAL_TABLET | Freq: Three times a day (TID) | ORAL | 0 refills | Status: AC | PRN

## 2017-05-20 MED ORDER — CYCLOBENZAPRINE HCL 10 MG PO TABS
10.0000 mg | ORAL_TABLET | Freq: Once | ORAL | Status: AC
  Administered 2017-05-20: 10 mg via ORAL
  Filled 2017-05-20: qty 1
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
## 2018-12-18 ENCOUNTER — Ambulatory Visit: Payer: Self-pay | Admitting: Physician Assistant

## 2018-12-18 ENCOUNTER — Other Ambulatory Visit: Payer: Self-pay

## 2018-12-18 ENCOUNTER — Other Ambulatory Visit: Payer: Self-pay | Admitting: Family Medicine

## 2018-12-18 DIAGNOSIS — L309 Dermatitis, unspecified: Secondary | ICD-10-CM

## 2018-12-18 DIAGNOSIS — Z113 Encounter for screening for infections with a predominantly sexual mode of transmission: Secondary | ICD-10-CM

## 2018-12-18 MED ORDER — CLOTRIMAZOLE 1 % EX CREA: 1.0000 "application " | TOPICAL_CREAM | Freq: Two times a day (BID) | CUTANEOUS | 0 refills | Status: AC

## 2018-12-18 NOTE — Progress Notes (Signed)
Pt here for STD screening.Treshon Stannard, RN 

## 2018-12-19 ENCOUNTER — Ambulatory Visit: Payer: Self-pay

## 2018-12-19 ENCOUNTER — Encounter: Payer: Self-pay | Admitting: Physician Assistant

## 2018-12-19 LAB — GRAM STAIN

## 2018-12-19 NOTE — Progress Notes (Signed)
STI clinic/screening visit  Subjective:  Anthony Tucker is a 43 y.o. male being seen today for an STI screening visit. The patient reports they do have symptoms.  Patient has the following medical conditions:  There are no active problems to display for this patient.    Chief Complaint  Patient presents with  . SEXUALLY TRANSMITTED DISEASE    STD screening    HPI  Patient reports that since he had sex about 4-5  days ago he has had itching in his pubic area.  Reports that he has used "blue star" ointment on the area and this has made the itching worse.  Denies other symptoms.   See flowsheet for further details and programmatic requirements.    The following portions of the patient's history were reviewed and updated as appropriate: allergies, current medications, past medical history, past social history, past surgical history and problem list.  Objective:  There were no vitals filed for this visit.  Physical Exam Constitutional:      General: He is not in acute distress.    Appearance: Normal appearance.  HENT:     Head: Normocephalic and atraumatic.     Mouth/Throat:     Mouth: Mucous membranes are moist.     Pharynx: Oropharynx is clear. No oropharyngeal exudate or posterior oropharyngeal erythema.  Eyes:     Conjunctiva/sclera: Conjunctivae normal.  Neck:     Musculoskeletal: Neck supple.  Pulmonary:     Effort: Pulmonary effort is normal.  Abdominal:     Palpations: Abdomen is soft. There is no mass.     Tenderness: There is no abdominal tenderness. There is no guarding or rebound.  Genitourinary:    Penis: Normal.      Scrotum/Testes: Normal.     Comments: Pubic area without nits, lice, edema, erythema, lesions and inguinal adenopathy. Penis circumcised and without discharge at meatus. Skin of shaft and scrotum, dry with slight scaling. Lymphadenopathy:     Cervical: No cervical adenopathy.  Skin:    General: Skin is warm and dry.     Findings:  No bruising, erythema, lesion or rash.  Neurological:     Mental Status: He is alert and oriented to person, place, and time.  Psychiatric:        Mood and Affect: Mood normal.        Behavior: Behavior normal.        Thought Content: Thought content normal.        Judgment: Judgment normal.       Assessment and Plan:  Anthony Tucker is a 43 y.o. male presenting to the [facility] for STI screening  1. Screening for STD (sexually transmitted disease) Patient with symptoms today.  Unable to do blood work today due to time constraints and patient scheduled to RTC on 12/19/2018 at 4pm per his request for blood work . Rec condoms with all sex.  Await test results.  Counseled that RN will call if needs to RTC for treatment once result is back.  - Gram stain - Gonococcus culture  2. Dermatitis Counseled that there are multiple reasons for itching. Counseled that he should stay with fragrance and dye free soap/detergent/fabric softener/body wash. Will try clotrimazole 1% cream BID for 14 days to ease itching. Rec to follow up with PCP if itching persists. - clotrimazole (CLOTRIMAZOLE ATHLETES FOOT) 1 % cream; Apply 1 application topically 2 (two) times daily.  Dispense: 30 g; Refill: 0  No follow-ups on file.  No future appointments.  Jerene Dilling, PA

## 2018-12-23 LAB — GONOCOCCUS CULTURE

## 2019-01-09 NOTE — Addendum Note (Signed)
Addended by: Cletis Media on: 01/09/2019 01:01 PM   Modules accepted: Orders

## 2020-08-17 ENCOUNTER — Other Ambulatory Visit: Payer: Self-pay

## 2020-08-17 DIAGNOSIS — Z23 Encounter for immunization: Secondary | ICD-10-CM | POA: Insufficient documentation

## 2020-08-17 DIAGNOSIS — S61411A Laceration without foreign body of right hand, initial encounter: Secondary | ICD-10-CM | POA: Insufficient documentation

## 2020-08-17 DIAGNOSIS — W268XXA Contact with other sharp object(s), not elsewhere classified, initial encounter: Secondary | ICD-10-CM | POA: Insufficient documentation

## 2020-08-17 DIAGNOSIS — Y9389 Activity, other specified: Secondary | ICD-10-CM | POA: Insufficient documentation

## 2020-08-17 DIAGNOSIS — Z87891 Personal history of nicotine dependence: Secondary | ICD-10-CM | POA: Insufficient documentation

## 2020-08-17 NOTE — ED Triage Notes (Addendum)
Pt states he was braking some wood up and cut his right thumb. Pt states he cleaned it really well and does not have any pain right now.  Laceration is 2.5-4.5cm in length.  Pt states last tetanus shot was 2013-2014

## 2020-08-17 NOTE — ED Notes (Signed)
Wet to dry gauze dressing placed over lacerations. No x-ray ordered at this time as per EDP. Pt able mo move thumb and all fingers, pt denies pain.

## 2020-08-18 ENCOUNTER — Emergency Department
Admission: EM | Admit: 2020-08-18 | Discharge: 2020-08-18 | Disposition: A | Discharge status: Home / Self Care | Payer: Self-pay | Attending: Emergency Medicine | Admitting: Emergency Medicine

## 2020-08-18 DIAGNOSIS — S61411A Laceration without foreign body of right hand, initial encounter: Secondary | ICD-10-CM

## 2020-08-18 MED ORDER — CEPHALEXIN 500 MG PO CAPS
500.0000 mg | ORAL_CAPSULE | Freq: Once | ORAL | Status: AC
Start: 2020-08-18 — End: 2020-08-18
  Administered 2020-08-18: 02:00:00 500 mg via ORAL
  Filled 2020-08-18: qty 1

## 2020-08-18 MED ORDER — CEPHALEXIN 500 MG PO CAPS: 500.0000 mg | ORAL_CAPSULE | Freq: Three times a day (TID) | ORAL | 0 refills | Status: AC

## 2020-08-18 MED ORDER — TETANUS-DIPHTH-ACELL PERTUSSIS 5-2.5-18.5 LF-MCG/0.5 IM SUSY
0.5000 mL | PREFILLED_SYRINGE | Freq: Once | INTRAMUSCULAR | Status: AC
  Administered 2020-08-18: 02:00:00 0.5 mL via INTRAMUSCULAR
  Filled 2020-08-18: qty 0.5

## 2020-08-18 NOTE — ED Provider Notes (Signed)
[facility] Emergency Department Provider Note   ____________________________________________   Event Date/Time   First MD Initiated Contact with Patient 08/18/20 0117     (approximate)  I have reviewed the triage vital signs and the nursing notes.   HISTORY  Chief Complaint Extremity Laceration (Right hand)    HPI Anthony Tucker is a 45 y.o. male who presents to the ED from home with a chief complaint of right hand laceration.  Patient is right-hand dominant, twisting and breaking up some wood and suffered a cut to the webspace between his right thumb and index finger.  States he washed it out very well at home, first with soapy water, then with hydrogen peroxide.  Denies foreign body sensation.  Thinks his last tetanus shot was 2013.  Voices no other complaints or injuries.     Past medical history None  There are no problems to display for this patient.   No past surgical history on file.  Prior to Admission medications   Medication Sig Start Date End Date Taking? Authorizing Provider  cephALEXin (KEFLEX) 500 MG capsule Take 1 capsule (500 mg total) by mouth 3 (three) times daily. 08/18/20  Yes Irean Hong, MD  amoxicillin (AMOXIL) 500 MG capsule Take 1 capsule (500 mg total) by mouth 3 (three) times daily. Patient not taking: Reported on 12/19/2018 02/15/16   Tommi Rumps, PA-C  clotrimazole (CLOTRIMAZOLE ATHLETES FOOT) 1 % cream Apply 1 application topically 2 (two) times daily. 12/18/18   Matt Holmes, PA  cyclobenzaprine (FLEXERIL) 10 MG tablet Take 1 tablet (10 mg total) by mouth 3 (three) times daily as needed. Patient not taking: Reported on 12/19/2018 05/20/17   Joni Reining, PA-C  docusate sodium (COLACE) 100 MG capsule Take 1 tablet once or twice daily as needed for constipation while taking narcotic pain medicine Patient not taking: Reported on 12/19/2018 07/13/15   Loleta Rose, MD  HYDROcodone-acetaminophen (NORCO/VICODIN)  5-325 MG tablet Take 1-2 tablets by mouth every 4 (four) hours as needed for moderate pain. Patient not taking: Reported on 12/19/2018 07/13/15   Loleta Rose, MD  ibuprofen (ADVIL,MOTRIN) 600 MG tablet Take 1 tablet (600 mg total) by mouth every 8 (eight) hours as needed. Patient not taking: Reported on 12/19/2018 05/20/17   Joni Reining, PA-C  penicillin v potassium (VEETID) 500 MG tablet Take 1 tablet (500 mg total) by mouth 4 (four) times daily. Patient not taking: Reported on 12/19/2018 07/13/15   Loleta Rose, MD  traMADol (ULTRAM) 50 MG tablet Take 1 tablet (50 mg total) by mouth every 12 (twelve) hours as needed. Patient not taking: Reported on 12/19/2018 05/20/17   Joni Reining, PA-C    Allergies Shellfish allergy  No family history on file.  Social History Social History   Tobacco Use  . Smoking status: Former    Packs/day: 0.25    Pack years: 0.00    Types: Cigarettes  . Smokeless tobacco: Never  Substance Use Topics  . Alcohol use: No  . Drug use: No    Review of Systems  Constitutional: No fever/chills Eyes: No visual changes. ENT: No sore throat. Cardiovascular: Denies chest pain. Respiratory: Denies shortness of breath. Gastrointestinal: No abdominal pain.  No nausea, no vomiting.  No diarrhea.  No constipation. Genitourinary: Negative for dysuria. Musculoskeletal: Positive for right hand laceration negative for back pain. Skin: Negative for rash. Neurological: Negative for headaches, focal weakness or numbness.   ____________________________________________   PHYSICAL EXAM:  VITAL SIGNS: ED Triage Vitals [08/17/20 2256]  Enc Vitals Group     BP (!) 158/104     Pulse Rate 91     Resp 20     Temp 98.2 F (36.8 C)     Temp src      SpO2 98 %     Weight 250 lb (113.4 kg)     Height 6\' 2"  (1.88 m)     Head Circumference      Peak Flow      Pain Score 0     Pain Loc      Pain Edu?      Excl. in GC?     Constitutional: Alert and oriented.  Well appearing and in no acute distress. Eyes: Conjunctivae are normal. PERRL. EOMI. Head: Atraumatic. Nose: No congestion/rhinnorhea. Mouth/Throat: Mucous membranes are moist.   Neck: No stridor.   Cardiovascular: Normal rate, regular rhythm. Grossly normal heart sounds.  Good peripheral circulation. Respiratory: Normal respiratory effort.  No retractions. Lungs CTAB. Gastrointestinal: Soft and nontender. No distention. No abdominal bruits. No CVA tenderness. Musculoskeletal:  Right hand: Approximately 2 cm avulsion type laceration in the webspace between thumb and index finger without active bleeding.  2+ radial pulse.  Brisk, less than 5-second capillary refill. No lower extremity tenderness nor edema.  No joint effusions. Neurologic:  Normal speech and language. No gross focal neurologic deficits are appreciated. No gait instability. Skin:  Skin is warm, dry and intact. No rash noted. Psychiatric: Mood and affect are normal. Speech and behavior are normal.  ____________________________________________   LABS (all labs ordered are listed, but only abnormal results are displayed)  Labs Reviewed - No data to display ____________________________________________  EKG  None ____________________________________________  RADIOLOGY I, Niyanna Asch J, personally viewed and evaluated these images (plain radiographs) as part of my medical decision making, as well as reviewing the written report by the radiologist.  ED MD interpretation: None  Official radiology report(s): No results found.  ____________________________________________   PROCEDURES  Procedure(s) performed (including Critical Care):  Marland KitchenLaceration Repair  Date/Time: 08/18/2020 1:42 AM Performed by: 10/19/2020, MD Authorized by: Irean Hong, MD   Consent:    Consent obtained:  Verbal   Consent given by:  Patient   Risks, benefits, and alternatives were discussed: yes     Risks discussed:   Infection Anesthesia:    Anesthesia method:  None Laceration details:    Location:  Hand   Hand location: web space 1st and 2nd digits.   Length (cm):  2   Depth (mm):  2 Treatment:    Area cleansed with:  Chlorhexidine   Amount of cleaning:  Standard Skin repair:    Repair method:  Steri-Strips and tissue adhesive   Number of Steri-Strips:  6 Approximation:    Approximation:  Close Repair type:    Repair type:  Simple Post-procedure details:    Dressing:  Bulky dressing   Procedure completion:  Tolerated well, no immediate complications   ____________________________________________   INITIAL IMPRESSION / ASSESSMENT AND PLAN / ED COURSE  As part of my medical decision making, I reviewed the following data within the electronic MEDICAL RECORD NUMBER Nursing notes reviewed and incorporated and Notes from prior ED visits     45 year old male who presents with right hand laceration.  Patient appears unsure of the date of his last tetanus; will update.  Discussed with patient options for repair.  Patient declines sutures.  Use Dermabond along the edges of  the wound and applied Steri-Strips across the wound.  Patient still maintains ability to move his thumb freely.  Will apply bulky dressing overnight.  Will place on Keflex.  Strict return precautions given.  Patient verbalizes understanding and agrees with plan of care.      ____________________________________________   FINAL CLINICAL IMPRESSION(S) / ED DIAGNOSES  Final diagnoses:  Laceration of right hand without foreign body, initial encounter     ED Discharge Orders          Ordered    cephALEXin (KEFLEX) 500 MG capsule  3 times daily        08/18/20 0132             Note:  This document was prepared using Dragon voice recognition software and may include unintentional dictation errors.    Irean Hong, MD 08/18/20 [phone]

## 2020-08-18 NOTE — Discharge Instructions (Addendum)
1. Take antibiotic as prescribed. 2.  Keep wound clean and dry. 3.  You may remove butterfly bandages in 7 to 10 days. 4.  Return to the ER for worsening symptoms, persistent vomiting, difficulty breathing or other concerns

## 2022-09-23 ENCOUNTER — Emergency Department: Payer: 59

## 2022-09-23 ENCOUNTER — Emergency Department
Admission: EM | Admit: 2022-09-23 | Discharge: 2022-09-23 | Disposition: A | Discharge status: Home / Self Care | Payer: 59 | Attending: Student in an Organized Health Care Education/Training Program | Admitting: Student in an Organized Health Care Education/Training Program

## 2022-09-23 ENCOUNTER — Other Ambulatory Visit: Payer: Self-pay

## 2022-09-23 DIAGNOSIS — R739 Hyperglycemia, unspecified: Secondary | ICD-10-CM

## 2022-09-23 DIAGNOSIS — E1165 Type 2 diabetes mellitus with hyperglycemia: Secondary | ICD-10-CM | POA: Diagnosis not present

## 2022-09-23 DIAGNOSIS — K760 Fatty (change of) liver, not elsewhere classified: Secondary | ICD-10-CM | POA: Diagnosis not present

## 2022-09-23 DIAGNOSIS — R109 Unspecified abdominal pain: Secondary | ICD-10-CM | POA: Diagnosis not present

## 2022-09-23 DIAGNOSIS — R197 Diarrhea, unspecified: Secondary | ICD-10-CM | POA: Diagnosis not present

## 2022-09-23 DIAGNOSIS — K5790 Diverticulosis of intestine, part unspecified, without perforation or abscess without bleeding: Secondary | ICD-10-CM | POA: Diagnosis not present

## 2022-09-23 DIAGNOSIS — K859 Acute pancreatitis without necrosis or infection, unspecified: Secondary | ICD-10-CM | POA: Diagnosis not present

## 2022-09-23 LAB — URINALYSIS, ROUTINE W REFLEX MICROSCOPIC
Bacteria, UA: NONE SEEN
Bilirubin Urine: NEGATIVE
Glucose, UA: >=500 mg/dL — AB
Hgb urine dipstick: NEGATIVE
Ketones, ur: NEGATIVE mg/dL
Leukocytes,Ua: NEGATIVE
Nitrite: NEGATIVE
Protein, ur: NEGATIVE mg/dL
Specific Gravity, Urine: 1.022 (ref 1.005–1.030)
Squamous Epithelial / HPF: NONE SEEN /HPF (ref 0–5)
WBC, UA: NONE SEEN WBC/hpf (ref 0–5)
pH: 6 (ref 5.0–8.0)

## 2022-09-23 LAB — CBC
HCT: 47.8 % (ref 39.0–52.0)
Hemoglobin: 15.9 g/dL (ref 13.0–17.0)
MCH: 29.7 pg (ref 26.0–34.0)
MCHC: 33.3 g/dL (ref 30.0–36.0)
MCV: 89.3 fL (ref 80.0–100.0)
Platelets: 259 10*3/uL (ref 150–400)
RBC: 5.35 MIL/uL (ref 4.22–5.81)
RDW: 13.2 % (ref 11.5–15.5)
WBC: 7.3 10*3/uL (ref 4.0–10.5)
nRBC: 0 % (ref 0.0–0.2)

## 2022-09-23 LAB — COMPREHENSIVE METABOLIC PANEL
ALT: 30 U/L (ref 0–44)
AST: 20 U/L (ref 15–41)
Albumin: 4 g/dL (ref 3.5–5.0)
Alkaline Phosphatase: 92 U/L (ref 38–126)
Anion gap: 9 (ref 5–15)
BUN: 11 mg/dL (ref 6–20)
CO2: 24 mmol/L (ref 22–32)
Calcium: 9.3 mg/dL (ref 8.9–10.3)
Chloride: 102 mmol/L (ref 98–111)
Creatinine, Ser: 0.78 mg/dL (ref 0.61–1.24)
GFR, Estimated: >60 mL/min (ref >60)
Glucose, Bld: 296 mg/dL — ABNORMAL HIGH (ref 70–99)
Potassium: 3.8 mmol/L (ref 3.5–5.1)
Sodium: 135 mmol/L (ref 135–145)
Total Bilirubin: 1.2 mg/dL (ref 0.3–1.2)
Total Protein: 8 g/dL (ref 6.5–8.1)

## 2022-09-23 LAB — LIPASE, BLOOD: Lipase: 59 U/L — ABNORMAL HIGH (ref 11–51)

## 2022-09-23 MED ORDER — METFORMIN HCL 500 MG PO TABS
500.0000 mg | ORAL_TABLET | Freq: Once | ORAL | Status: AC
  Administered 2022-09-23: 500 mg via ORAL
  Filled 2022-09-23: qty 1

## 2022-09-23 MED ORDER — MORPHINE SULFATE (PF) 4 MG/ML IV SOLN
4.0000 mg | INTRAVENOUS | Status: DC | PRN
  Filled 2022-09-23: qty 1

## 2022-09-23 MED ORDER — INSULIN ASPART 100 UNIT/ML IJ SOLN
10.0000 [IU] | Freq: Once | INTRAMUSCULAR | Status: DC
  Filled 2022-09-23: qty 1

## 2022-09-23 MED ORDER — SODIUM CHLORIDE 0.9 % IV BOLUS: 1000.0000 mL | Freq: Once | INTRAVENOUS | Status: DC

## 2022-09-23 MED ORDER — ONDANSETRON HCL 4 MG/2ML IJ SOLN
4.0000 mg | Freq: Once | INTRAMUSCULAR | Status: DC
  Filled 2022-09-23: qty 2

## 2022-09-23 NOTE — ED Triage Notes (Signed)
Pt c/o abdominal pain x2 days on the LLQ. Pt denies N/V/D, reports mild bloating. Abdomen is round, distended. Pt has gallbladder and appendix. Last BM last night.

## 2022-09-23 NOTE — ED Provider Notes (Signed)
[facility] Provider Note    Event Date/Time   First MD Initiated Contact with Patient 09/23/22 1845     (approximate)   History   Abdominal Pain   HPI  Anthony Tucker is a 47 y.o. male no significant past medical history presents to the ER for evaluation of episode of diarrhea started last night.  Had several episodes of nonbloody nonmelanotic.  No abdominal pain or discomfort rating through to his back.  He has been able to tolerate p.o. eating and drinking.  Not worsened by eating or drinking today.  Denies any history of diabetes but does have significant family history of diabetes.  Did not drink any alcohol last night.     Physical Exam   Triage Vital Signs: ED Triage Vitals  Encounter Vitals Group     BP 09/23/22 1515 (!) 164/98     Systolic BP Percentile --      Diastolic BP Percentile --      Pulse Rate 09/23/22 1515 (!) 102     Resp 09/23/22 1515 17     Temp 09/23/22 1515 98.3 F (36.8 C)     Temp Source 09/23/22 1515 Oral     SpO2 09/23/22 1515 95 %     Weight --      Height --      Head Circumference --      Peak Flow --      Pain Score 09/23/22 1513 6     Pain Loc --      Pain Education --      Exclude from Growth Chart --     Most recent vital signs: Vitals:   09/23/22 1515 09/23/22 1946  BP: (!) 164/98 (!) 168/105  Pulse: (!) 102 83  Resp: 17 19  Temp: 98.3 F (36.8 C)   SpO2: 95% 93%     Constitutional: Alert  Eyes: Conjunctivae are normal.  Head: Atraumatic. Nose: No congestion/rhinnorhea. Mouth/Throat: Mucous membranes are moist.   Neck: Painless ROM.  Cardiovascular:   Good peripheral circulation. Respiratory: Normal respiratory effort.  No retractions.  Gastrointestinal: Soft and nontender in all 4 quadrants. Musculoskeletal:  no deformity Neurologic:  MAE spontaneously. No gross focal neurologic deficits are appreciated.  Skin:  Skin is warm, dry and intact. No rash noted. Psychiatric: Mood and  affect are normal. Speech and behavior are normal.    ED Results / Procedures / Treatments   Labs (all labs ordered are listed, but only abnormal results are displayed) Labs Reviewed  LIPASE, BLOOD - Abnormal; Notable for the following components:      Result Value   Lipase 59 (*)    All other components within normal limits  COMPREHENSIVE METABOLIC PANEL - Abnormal; Notable for the following components:   Glucose, Bld 296 (*)    All other components within normal limits  URINALYSIS, ROUTINE W REFLEX MICROSCOPIC - Abnormal; Notable for the following components:   Color, Urine STRAW (*)    APPearance CLEAR (*)    Glucose, UA >=500 (*)    All other components within normal limits  CBC     EKG     RADIOLOGY Please see ED Course for my review and interpretation.  I personally reviewed all radiographic images ordered to evaluate for the above acute complaints and reviewed radiology reports and findings.  These findings were personally discussed with the patient.  Please see medical record for radiology report.    PROCEDURES:  Critical Care performed:  Procedures   MEDICATIONS ORDERED IN ED: Medications  metFORMIN (GLUCOPHAGE) tablet 500 mg (500 mg Oral Given 09/23/22 1921)     IMPRESSION / MDM / ASSESSMENT AND PLAN / ED COURSE  I reviewed the triage vital signs and the nursing notes.                              Differential diagnosis includes, but is not limited to, dehydration, colitis, diverticulitis, pancreatitis, hyperglycemia, dehydration, UTI, electrolyte abnormality  Patient presenting to the ER for evaluation of symptoms as described above.  Based on symptoms, risk factors and considered above differential, this presenting complaint could reflect a potentially life-threatening illness therefore the patient will be placed on continuous pulse oximetry and telemetry for monitoring.  Laboratory evaluation will be sent to evaluate for the above complaints.  CT  imaging ordered at triage shows no sign of perforation or abscess or obstruction.  Possible inflammation in the tail of the pancreas.  Lipase is only mildly elevated.  He is not having symptoms of pancreatitis at this time denies any abdominal pain no nausea or vomiting.  We discussed this finding.  He does have hyperglycemia and I did recommend IV fluids as well as insulin to bring his blood sugar down.  We discussed the importance of glycemic control and my reasoning for the recommendation.  Patient declining this stating that he feels well and prefer outpatient follow-up therefore will give metformin patient tolerating p.o.  Discussed strict return precautions patient does seem reliable.  We also discussed the importance of follow-up PCP.       FINAL CLINICAL IMPRESSION(S) / ED DIAGNOSES   Final diagnoses:  Diarrhea, unspecified type  Hyperglycemia     Rx / DC Orders   ED Discharge Orders          Ordered    Ambulatory Referral to Primary Care (Establish Care)       Comments: DM   09/23/22 1943             Note:  This document was prepared using Dragon voice recognition software and may include unintentional dictation errors.    Willy Eddy, MD 09/23/22 [phone]

## 2023-07-08 ENCOUNTER — Emergency Department
Admission: EM | Admit: 2023-07-08 | Discharge: 2023-07-08 | Disposition: A | Discharge status: Home / Self Care | Attending: Emergency Medicine | Admitting: Emergency Medicine

## 2023-07-08 DIAGNOSIS — G510 Bell's palsy: Secondary | ICD-10-CM | POA: Insufficient documentation

## 2023-07-08 DIAGNOSIS — R739 Hyperglycemia, unspecified: Secondary | ICD-10-CM | POA: Diagnosis not present

## 2023-07-08 DIAGNOSIS — R29810 Facial weakness: Secondary | ICD-10-CM | POA: Diagnosis present

## 2023-07-08 LAB — COMPREHENSIVE METABOLIC PANEL WITH GFR
ALT: 28 U/L (ref 0–44)
AST: 29 U/L (ref 15–41)
Albumin: 3.7 g/dL (ref 3.5–5.0)
Alkaline Phosphatase: 79 U/L (ref 38–126)
Anion gap: 10 (ref 5–15)
BUN: 12 mg/dL (ref 6–20)
CO2: 24 mmol/L (ref 22–32)
Calcium: 9.1 mg/dL (ref 8.9–10.3)
Chloride: 99 mmol/L (ref 98–111)
Creatinine, Ser: 0.81 mg/dL (ref 0.61–1.24)
GFR, Estimated: >60 mL/min (ref >60)
Glucose, Bld: 364 mg/dL — ABNORMAL HIGH (ref 70–99)
Potassium: 3.9 mmol/L (ref 3.5–5.1)
Sodium: 133 mmol/L — ABNORMAL LOW (ref 135–145)
Total Bilirubin: 1.5 mg/dL — ABNORMAL HIGH (ref 0.0–1.2)
Total Protein: 7.3 g/dL (ref 6.5–8.1)

## 2023-07-08 LAB — CBC WITH DIFFERENTIAL/PLATELET
Abs Immature Granulocytes: 0.03 10*3/uL (ref 0.00–0.07)
Basophils Absolute: 0 10*3/uL (ref 0.0–0.1)
Basophils Relative: 1 %
Eosinophils Absolute: 0.1 10*3/uL (ref 0.0–0.5)
Eosinophils Relative: 1 %
HCT: 46.1 % (ref 39.0–52.0)
Hemoglobin: 16 g/dL (ref 13.0–17.0)
Immature Granulocytes: 1 %
Lymphocytes Relative: 40 %
Lymphs Abs: 2.4 10*3/uL (ref 0.7–4.0)
MCH: 30.1 pg (ref 26.0–34.0)
MCHC: 34.7 g/dL (ref 30.0–36.0)
MCV: 86.8 fL (ref 80.0–100.0)
Monocytes Absolute: 0.4 10*3/uL (ref 0.1–1.0)
Monocytes Relative: 7 %
Neutro Abs: 3.1 10*3/uL (ref 1.7–7.7)
Neutrophils Relative %: 50 %
Platelets: 245 10*3/uL (ref 150–400)
RBC: 5.31 MIL/uL (ref 4.22–5.81)
RDW: 13.3 % (ref 11.5–15.5)
WBC: 6.1 10*3/uL (ref 4.0–10.5)
nRBC: 0 % (ref 0.0–0.2)

## 2023-07-08 MED ORDER — PREDNISONE 20 MG PO TABS: 60.0000 mg | ORAL_TABLET | Freq: Every day | ORAL | 0 refills | Status: AC

## 2023-07-08 MED ORDER — BLOOD GLUCOSE TEST VI STRP: 1.0000 | ORAL_STRIP | Freq: Three times a day (TID) | 0 refills | Status: AC

## 2023-07-08 MED ORDER — LANCET DEVICE MISC: 1.0000 | Freq: Three times a day (TID) | 0 refills | Status: AC

## 2023-07-08 MED ORDER — BLOOD GLUCOSE MONITORING SUPPL DEVI: 1.0000 | Freq: Three times a day (TID) | 0 refills | Status: AC

## 2023-07-08 MED ORDER — GLIPIZIDE ER 2.5 MG PO TB24: ORAL_TABLET | ORAL | 0 refills | Status: AC

## 2023-07-08 MED ORDER — METFORMIN HCL 500 MG PO TABS: 1000.0000 mg | ORAL_TABLET | Freq: Two times a day (BID) | ORAL | 0 refills | Status: AC

## 2023-07-08 MED ORDER — ERYTHROMYCIN 5 MG/GM OP OINT: 1.0000 | TOPICAL_OINTMENT | Freq: Every day | OPHTHALMIC | 0 refills | Status: AC

## 2023-07-08 MED ORDER — PREDNISONE 20 MG PO TABS: 60.0000 mg | ORAL_TABLET | Freq: Every day | ORAL | 0 refills | Status: DC

## 2023-07-08 MED ORDER — GLIPIZIDE ER 2.5 MG PO TB24: 2.5000 mg | ORAL_TABLET | Freq: Every day | ORAL | 0 refills | Status: DC

## 2023-07-08 MED ORDER — METFORMIN HCL 500 MG PO TABS: 500.0000 mg | ORAL_TABLET | Freq: Two times a day (BID) | ORAL | 0 refills | Status: DC

## 2023-07-08 MED ORDER — LANCETS MISC. MISC
1.0000 | Freq: Three times a day (TID) | 0 refills | Status: AC
Start: 2023-07-08 — End: 2023-08-07

## 2023-07-08 NOTE — ED Notes (Signed)
 Patient Alert and oriented to baseline. Stable and ambulatory to baseline. Patient verbalized understanding of the discharge instructions.  Patient belongings were taken by the patient.

## 2023-07-08 NOTE — Discharge Instructions (Addendum)
 Metformin  1000 mg twice daily & Glipizide XL 2.5 mg BID while on steroids then down to once daily when steroids are done.  It is important to do diet changes as I am concerned that you have new onset diabetes as well as Bell's palsy.  The steroids will make the sugars go up higher and this can lead to very bad outcomes if you are not controlling your sugars by taking the metformin  and changing your diet.  If you develop nausea vomiting weakness she should return to the ER for recheck.  Your blood pressure is also elevated it is important that you follow-up with a primary care doctor for a recheck.  These are very important to manage long-term.  Artificial tears every hour while patient is awake Ophthalmic ointment at night Eye should be taped shut at night Protective glasses or goggles

## 2023-07-08 NOTE — ED Provider Notes (Addendum)
 [facility] Provider Note    Event Date/Time   First MD Initiated Contact with Patient 07/08/23 0710     (approximate)   History   Facial Droop   HPI  Anthony Tucker is a 48 y.o. male who is otherwise healthy who comes in with facial droop since yesterday.  Last known normal was 11:00 yesterday.  Patient denies any history of stroke, blood clots, headaches, dizziness.  Patient states that he is unable to move the right forehead.  He denies any numbness, tingling, weakness, shortness of breath. He denies any tick bites.  Denies anything like this happening before.  He denies any vision changes  I reviewed note where patient was seen on 09/23/2022 noted to be hypertensive with sugar of 296.  They had started patient on metformin  and placed a referral for a primary care doctor which patient did not do.   Physical Exam   Triage Vital Signs: ED Triage Vitals [07/08/23 0647]  Encounter Vitals Group     BP      Systolic BP Percentile      Diastolic BP Percentile      Pulse      Resp      Temp      Temp src      SpO2      Weight      Height      Head Circumference      Peak Flow      Pain Score 0     Pain Loc      Pain Education      Exclude from Growth Chart     Most recent vital signs: Vitals:   07/08/23 0730 07/08/23 0742  BP: (!) 161/111   Pulse: 94   Resp: (!) 26   Temp:  98.4 F (36.9 C)  SpO2: 96%      General: Awake, no distress.  CV:  Good peripheral perfusion.  Resp:  Normal effort.  Abd:  No distention.  Other:  CN nerves are intact other than noted right sided facial droop.  He is unable to move the right forehead.  Weakness close in the right eye but able to fully close it. TM is clear.  No rash noted.  Sensation intact and equal throughout.  No arm or leg sensation changes or weakness.  No aphasia.  ED Results / Procedures / Treatments   Labs (all labs ordered are listed, but only abnormal results are  displayed) Labs Reviewed  COMPREHENSIVE METABOLIC PANEL WITH GFR - Abnormal; Notable for the following components:      Result Value   Sodium 133 (*)    Glucose, Bld 364 (*)    Total Bilirubin 1.5 (*)    All other components within normal limits  CBC WITH DIFFERENTIAL/PLATELET     EKG  My interpretation of EKG:  Sinus tachycardia rate of 102 without any ST elevation, T wave inversion in lead III  RADIOLOGY Declined   PROCEDURES:  Critical Care performed: No  Procedures   MEDICATIONS ORDERED IN ED: Medications - No data to display   IMPRESSION / MDM / ASSESSMENT AND PLAN / ED COURSE  I reviewed the triage vital signs and the nursing notes.   Patient's presentation is most consistent with acute presentation with potential threat to life or bodily function.   Patient comes in with facial droop that is consistent with Bell's palsy.  We discussed CT imaging of the head to rule out intracranial  mass or intracranial hemorrhage but patient denies any headaches or concerns for this.  We discussed MRI for stroke but given he has forehead involvement we discussed how this is most likely Bell's palsy.  They did not want to undergo any CT or MRI given exam consistent with Bell's palsy.  I did review a recent note where patient was hyperglycemic.  He never got started on the metformin .  We discussed the concern that patient was going to be started on steroids for Bell's palsy and this is going to make the sugar even higher.  We discussed diet changes and the risk for DKA.  Patient understands this and would like to proceed with steroids and will monitor for symptoms to suggest DKA and will return.  However he does want to proceed with steroids to try to help with the Bell's palsy.  It we will start patient on metformin  to try to help counter this but he understands that diet change and metformin  may still not be enough to help with the steroids.  We discussed antiviral medications but  given poor data patient would like to hold off on this.  We discussed taping the eye shut if it is unable to close but currently can close fully, artificial eyedrops and ophthalmology follow-up but he denies any vision changes at this time  I did discuss with the diabetes coordinator who also did recommend glipizide, discussed with family the risk for hypoglycemia but given the steroids and sugars in the 300s she has low suspicion for this occurring.  She was going to place a note and discuss further with the patient and get close follow-up.  Clarified with diabetes coordinator and she did want 1000 mg twice daily of metformin  and the glipizide XL 2.5 BID while steroids and then once daily when done. Okay to do XL.   FINAL CLINICAL IMPRESSION(S) / ED DIAGNOSES   Final diagnoses:  Bell's palsy  Hyperglycemia     Rx / DC Orders   ED Discharge Orders     None        Note:  This document was prepared using Dragon voice recognition software and may include unintentional dictation errors.   Lubertha Rush, MD 07/08/23 1610    Lubertha Rush, MD 07/08/23 1017

## 2023-07-08 NOTE — ED Triage Notes (Signed)
 Pt to ED with right sided facial droop since yesterday. LKW 1100 yesterday. Pt is not experiencing any loss of sensation in face/limbs nor weakness/visual changes/. Denies hx of stroke/blood clots/headache/dizziness. A&O x4.

## 2023-07-08 NOTE — Inpatient Diabetes Management (Signed)
 Inpatient Diabetes Program Recommendations  AACE/ADA: New Consensus Statement on Inpatient Glycemic Control (2015)  Target Ranges:  Prepandial:   less than 140 mg/dL      Peak postprandial:   less than 180 mg/dL (1-2 hours)      Critically ill patients:  140 - 180 mg/dL   Review of Glycemic Control  Latest Reference Range & Units 07/08/23 07:42  Glucose 70 - 99 mg/dL 604 (H)  (H): Data is abnormally high Diabetes history: No DM hx Outpatient Diabetes medications: none Current orders for Inpatient glycemic control: none  Inpatient Diabetes Program Recommendations:    At discharge recommend glucometer/test strips/lancets, Metformin  1000 mg BID & Glipizide 2.5 mg BID.   Discussed with Dr Peggi Bowels.   Spoke with patient and support person regarding hyperglycemia. Explained what a A1c is and what it measures. Also reviewed goal A1c with patient, importance of good glucose control @ home, and blood sugar goals. Reviewed patho of DM, target goals, signs and symptoms of hyper vs hypoglycemia, role of pancreas, impact of steroids, need for A1C, glucose on serum, risks of continued hyperglycemia, DKA, potential need for insulin , interventions, Metformin , Glipizide, how to use glucometer, vascular changes and other commorbidities..  Patient will need a meter at discharge. Reviewed recommended frequency for checking CBGs. Stressed importance of close follow up with PCP. Patient does not have a provider. Educated on how to find and make appointment.  Admits to drinking sugary beverages. Reviewed alternatives, plate method, basic carb counting, foods that are higher in CHO and increase protein.  No additional questions at this time.  MD placed outpatient education. Will order LWWDM.   Thanks, Marjo Sievert, MSN, RNC-OB Diabetes Coordinator [phone] (8a-5p)
